# Patient Record
Sex: Male | Born: 1935 | Race: White | Hispanic: No | Marital: Married | Smoking: Never smoker
Health system: Southern US, Community
[De-identification: ages and names within clinical notes are randomized; demographics above are authoritative.]

---

## 2007-09-08 ENCOUNTER — Encounter: Payer: Self-pay | Admitting: Nurse Practitioner

## 2007-09-26 ENCOUNTER — Encounter: Payer: Self-pay | Admitting: Nurse Practitioner

## 2007-10-27 ENCOUNTER — Encounter: Payer: Self-pay | Admitting: Nurse Practitioner

## 2007-11-26 ENCOUNTER — Encounter: Payer: Self-pay | Admitting: Nurse Practitioner

## 2015-10-02 ENCOUNTER — Other Ambulatory Visit (HOSPITAL_COMMUNITY): Payer: Self-pay | Admitting: Cardiothoracic Surgery

## 2015-10-02 ENCOUNTER — Ambulatory Visit (HOSPITAL_COMMUNITY)
Admission: RE | Admit: 2015-10-02 | Discharge: 2015-10-02 | Disposition: A | Discharge status: Home / Self Care | Payer: Medicare HMO | Source: Ambulatory Visit | Attending: Cardiothoracic Surgery | Admitting: Cardiothoracic Surgery

## 2015-10-02 DIAGNOSIS — M79604 Pain in right leg: Secondary | ICD-10-CM

## 2015-10-02 DIAGNOSIS — M79605 Pain in left leg: Secondary | ICD-10-CM

## 2015-10-02 DIAGNOSIS — M7989 Other specified soft tissue disorders: Secondary | ICD-10-CM | POA: Diagnosis present

## 2016-03-03 ENCOUNTER — Emergency Department (HOSPITAL_COMMUNITY)
Admission: EM | Admit: 2016-03-03 | Discharge: 2016-03-03 | Disposition: A | Discharge status: Home / Self Care | Payer: Medicare HMO | Attending: Emergency Medicine | Admitting: Emergency Medicine

## 2016-03-03 ENCOUNTER — Emergency Department (HOSPITAL_COMMUNITY): Payer: Medicare HMO

## 2016-03-03 ENCOUNTER — Encounter (HOSPITAL_COMMUNITY): Payer: Self-pay | Admitting: *Deleted

## 2016-03-03 DIAGNOSIS — Y939 Activity, unspecified: Secondary | ICD-10-CM | POA: Diagnosis not present

## 2016-03-03 DIAGNOSIS — Z043 Encounter for examination and observation following other accident: Secondary | ICD-10-CM | POA: Insufficient documentation

## 2016-03-03 DIAGNOSIS — R2681 Unsteadiness on feet: Secondary | ICD-10-CM | POA: Insufficient documentation

## 2016-03-03 DIAGNOSIS — W010XXA Fall on same level from slipping, tripping and stumbling without subsequent striking against object, initial encounter: Secondary | ICD-10-CM | POA: Diagnosis not present

## 2016-03-03 DIAGNOSIS — Y92009 Unspecified place in unspecified non-institutional (private) residence as the place of occurrence of the external cause: Secondary | ICD-10-CM | POA: Insufficient documentation

## 2016-03-03 DIAGNOSIS — Z79899 Other long term (current) drug therapy: Secondary | ICD-10-CM | POA: Diagnosis not present

## 2016-03-03 DIAGNOSIS — S99911A Unspecified injury of right ankle, initial encounter: Secondary | ICD-10-CM | POA: Diagnosis present

## 2016-03-03 DIAGNOSIS — W19XXXA Unspecified fall, initial encounter: Secondary | ICD-10-CM

## 2016-03-03 DIAGNOSIS — Y999 Unspecified external cause status: Secondary | ICD-10-CM | POA: Diagnosis not present

## 2016-03-03 NOTE — ED Triage Notes (Signed)
Pt comes in from [facility] from home. Pt had an unwitnessed fall at home. He is having right ankle pain as well. Pt was on the floor for about an hour. Pt states he tripped and fell. Pt alert and oriented at triage. Pt told EMS he was having right groin pain.   CBG 285.

## 2016-03-03 NOTE — ED Provider Notes (Signed)
[facility]Y DEPT Provider Note   CSN: [id number] Arrival date & time: 03/03/16  [id number]   By signing my name below, I, Bobbie Stackhristopher Reid, attest that this documentation has been prepared under the direction and in the presence of Nira ConnPedro Eduardo Cardama, MD. Electronically Signed: Bobbie Stackhristopher Reid, Scribe. 03/03/16. 9:53 AM. History   Chief Complaint Chief Complaint  Patient presents with  . Fall    The history is provided by the patient and a relative. No language interpreter was used.    HPI Comments: Sean Montgomery is a 83 y.o. male brought in by ambulance, who presents to the Emergency Department complaining of groin pain s/p mechanical fall that occurred around 8:30 am today; which has since resolved en route. While en route to the ED patient was experiencing some associated ankle pain which has since resolved. He denies head injury; LOC, or amnesia to the event.  Currently denies any physical complaints.  Per family: Patient has been falling quite frequently recently due to shuffling gait. He has been having weakness in his legs for the past 6 months. He denies dizziness, headache, fever, congestion, and rhinorrhea. Patient did eat breakfast this morning.   History reviewed. No pertinent past medical history.  There are no active problems to display for this patient.   History reviewed. No pertinent surgical history.     Home Medications    Prior to Admission medications   Medication Sig Start Date End Date Taking? Authorizing Provider  doxazosin (CARDURA) 8 MG tablet Take 1 tablet by mouth daily. 12/30/15  Yes Historical Provider, MD  furosemide (LASIX) 20 MG tablet Take 1 tablet by mouth daily. 03/01/16  Yes Historical Provider, MD  lisinopril (PRINIVIL,ZESTRIL) 10 MG tablet Take 1 tablet by mouth daily. 03/01/16  Yes Historical Provider, MD  Vitamin D, Ergocalciferol, (DRISDOL) 50000 units CAPS capsule Take 50,000 Units by mouth every 7 (seven) days.   Yes Historical  Provider, MD    Family History No family history on file.  Social History Social History  Substance Use Topics  . Smoking status: Never Smoker  . Smokeless tobacco: Never Used  . Alcohol use No     Allergies   Patient has no known allergies.   Review of Systems Review of Systems A complete 10 system review of systems was obtained and all systems are negative except as noted in the HPI and PMH.    Physical Exam Updated Vital Signs BP 132/80 (BP Location: Left Arm)   Pulse 109   Temp 98.3 F (36.8 C) (Oral)   Resp 18   Ht 6' (1.829 m)   Wt 155 lb (70.3 kg)   SpO2 95%   BMI 21.02 kg/m   Physical Exam  Constitutional: He is oriented to person, place, and time. He appears well-developed and well-nourished. No distress.  HENT:  Head: Normocephalic.  Right Ear: External ear normal.  Left Ear: External ear normal.  Mouth/Throat: Oropharynx is clear and moist.  Eyes: Conjunctivae and EOM are normal. Pupils are equal, round, and reactive to light. Right eye exhibits no discharge. Left eye exhibits no discharge. No scleral icterus.  Neck: Normal range of motion. Neck supple.  Cardiovascular: Regular rhythm and normal heart sounds.  Exam reveals no gallop and no friction rub.   No murmur heard. Pulses:      Radial pulses are 2+ on the right side, and 2+ on the left side.       Dorsalis pedis pulses are 2+ on the right side, and  2+ on the left side.  Pulmonary/Chest: Effort normal and breath sounds normal. No stridor. No respiratory distress.  Abdominal: Soft. He exhibits no distension. There is no tenderness.  Musculoskeletal:       Right hip: He exhibits normal range of motion, normal strength, no tenderness and no bony tenderness.       Left hip: He exhibits normal range of motion, normal strength, no tenderness and no bony tenderness.       Right knee: No tenderness found.       Left knee: No tenderness found.       Right ankle: No tenderness.       Left ankle: No  tenderness.       Cervical back: He exhibits no bony tenderness.       Thoracic back: He exhibits no bony tenderness.       Lumbar back: He exhibits no bony tenderness.  Clavicle stable. Chest stable to AP/Lat compression. Pelvis stable to Lat compression. No obvious extremity deformity. No chest or abdominal wall contusion.  Neurological: He is alert and oriented to person, place, and time. GCS eye subscore is 4. GCS verbal subscore is 5. GCS motor subscore is 6.  Moving all extremities   Skin: Skin is warm. He is not diaphoretic.     ED Treatments / Results  DIAGNOSTIC STUDIES: Oxygen Saturation is 95% on RA, adequate by my interpretation.    COORDINATION OF CARE: 9:53 AM Discussed treatment plan with pt at bedside and pt agreed to plan.  Labs (all labs ordered are listed, but only abnormal results are displayed) Labs Reviewed - No data to display  EKG  EKG Interpretation None       Radiology Ct Head Wo Contrast  Result Date: 03/03/2016 CLINICAL DATA:  83 year old male with history of fall this morning. No injury to the head. EXAM: CT HEAD WITHOUT CONTRAST TECHNIQUE: Contiguous axial images were obtained from the base of the skull through the vertex without intravenous contrast. COMPARISON:  No priors. FINDINGS: Brain: Mild cerebral atrophy. Patchy and confluent areas of decreased attenuation are noted throughout the deep and periventricular white matter of the cerebral hemispheres bilaterally, compatible with chronic microvascular ischemic disease.No evidence of acute infarction, hemorrhage, hydrocephalus, extra-axial collection or mass lesion/mass effect. Vascular: No hyperdense vessel or unexpected calcification. Skull: Normal. Negative for fracture or focal lesion. Sinuses/Orbits: No acute finding. Other: None. IMPRESSION: 1. No acute intracranial abnormalities. 2. Mild cerebral atrophy with extensive chronic microvascular ischemic changes in cerebral white matter, as above.  Electronically Signed   By: Trudie Reed M.D.   On: 03/03/2016 10:49    Procedures Procedures (including critical care time)  Medications Ordered in ED Medications - No data to display   Initial Impression / Assessment and Plan / ED Course  I have reviewed the triage vital signs and the nursing notes.  Pertinent labs & imaging results that were available during my care of the patient were reviewed by me and considered in my medical decision making (see chart for details).  Clinical Course as of Mar 03 1108  Wynelle Link Mar 03, 2016  1045 No acute injuries noted on exam. Patient is oriented 4 and able to clearly described events surrounding the episode. CT head obtained to rule out possible slow subdural hemorrhage from previous falls.  [PC]  1106 Ct head negative  [PC]  1106 Able to ambulate at baseline.  [PC]  1106 The patient is safe for discharge with strict return precautions.   [  PC]    Clinical Course User Index [PC] Nira Conn, MD      Final Clinical Impressions(s) / ED Diagnoses   Final diagnoses:  Fall, initial encounter   Disposition: Discharge  Condition: Good  I have discussed the results, Dx and Tx plan with the patient who expressed understanding and agree(s) with the plan. Discharge instructions discussed at great length. The patient was given strict return precautions who verbalized understanding of the instructions. No further questions at time of discharge.    Current Discharge Medication List      Follow Up: Altamease Oiler, FNP [address] [place] [address] [phone]  Schedule an appointment as soon as possible for a visit  As needed   I personally performed the services described in this documentation, which was scribed in my presence. The recorded information has been reviewed and is accurate.        Nira Conn, MD 03/03/16 [phone]

## 2016-03-03 NOTE — ED Notes (Signed)
Pt ambulated in hall with assistance, Burley Saveredro viewed pt walking and approved pt to go home.

## 2016-03-05 ENCOUNTER — Emergency Department
Admission: EM | Admit: 2016-03-05 | Discharge: 2016-03-09 | Disposition: A | Discharge status: Home / Self Care | Payer: Medicare HMO | Attending: Emergency Medicine | Admitting: Emergency Medicine

## 2016-03-05 ENCOUNTER — Emergency Department: Payer: Medicare HMO

## 2016-03-05 ENCOUNTER — Encounter: Payer: Self-pay | Admitting: Emergency Medicine

## 2016-03-05 DIAGNOSIS — F0391 Unspecified dementia with behavioral disturbance: Secondary | ICD-10-CM | POA: Diagnosis not present

## 2016-03-05 DIAGNOSIS — Z79899 Other long term (current) drug therapy: Secondary | ICD-10-CM | POA: Insufficient documentation

## 2016-03-05 DIAGNOSIS — R410 Disorientation, unspecified: Secondary | ICD-10-CM | POA: Insufficient documentation

## 2016-03-05 DIAGNOSIS — F03918 Unspecified dementia, unspecified severity, with other behavioral disturbance: Secondary | ICD-10-CM

## 2016-03-05 DIAGNOSIS — R2681 Unsteadiness on feet: Secondary | ICD-10-CM | POA: Insufficient documentation

## 2016-03-05 DIAGNOSIS — A498 Other bacterial infections of unspecified site: Secondary | ICD-10-CM

## 2016-03-05 DIAGNOSIS — R4182 Altered mental status, unspecified: Secondary | ICD-10-CM | POA: Diagnosis present

## 2016-03-05 DIAGNOSIS — R531 Weakness: Secondary | ICD-10-CM

## 2016-03-05 DIAGNOSIS — F0390 Unspecified dementia without behavioral disturbance: Secondary | ICD-10-CM

## 2016-03-05 DIAGNOSIS — I10 Essential (primary) hypertension: Secondary | ICD-10-CM

## 2016-03-05 LAB — COMPREHENSIVE METABOLIC PANEL
ALK PHOS: 80 U/L (ref 38–126)
ALT: 30 U/L (ref 17–63)
ANION GAP: 10 (ref 5–15)
AST: 42 U/L — ABNORMAL HIGH (ref 15–41)
Albumin: 3.8 g/dL (ref 3.5–5.0)
BILIRUBIN TOTAL: 1.2 mg/dL (ref 0.3–1.2)
BUN: 24 mg/dL — ABNORMAL HIGH (ref 6–20)
CO2: 22 mmol/L (ref 22–32)
Calcium: 8.8 mg/dL — ABNORMAL LOW (ref 8.9–10.3)
Chloride: 105 mmol/L (ref 101–111)
Creatinine, Ser: 0.97 mg/dL (ref 0.61–1.24)
Glucose, Bld: 188 mg/dL — ABNORMAL HIGH (ref 65–99)
Potassium: 4 mmol/L (ref 3.5–5.1)
Sodium: 137 mmol/L (ref 135–145)
TOTAL PROTEIN: 6.6 g/dL (ref 6.5–8.1)

## 2016-03-05 LAB — CBC WITH DIFFERENTIAL/PLATELET
BASOS PCT: 0 %
Basophils Absolute: 0 10*3/uL (ref 0–0.1)
EOS ABS: 0.1 10*3/uL (ref 0–0.7)
Eosinophils Relative: 2 %
HCT: 43.6 % (ref 40.0–52.0)
HEMOGLOBIN: 14.7 g/dL (ref 13.0–18.0)
Lymphocytes Relative: 6 %
Lymphs Abs: 0.5 10*3/uL — ABNORMAL LOW (ref 1.0–3.6)
MCH: 32.3 pg (ref 26.0–34.0)
MCHC: 33.7 g/dL (ref 32.0–36.0)
MCV: 95.9 fL (ref 80.0–100.0)
MONOS PCT: 8 %
Monocytes Absolute: 0.6 10*3/uL (ref 0.2–1.0)
NEUTROS PCT: 84 %
Neutro Abs: 6.6 10*3/uL — ABNORMAL HIGH (ref 1.4–6.5)
Platelets: 256 10*3/uL (ref 150–440)
RBC: 4.54 MIL/uL (ref 4.40–5.90)
RDW: 14.1 % (ref 11.5–14.5)
WBC: 7.8 10*3/uL (ref 3.8–10.6)

## 2016-03-05 LAB — URINALYSIS, COMPLETE (UACMP) WITH MICROSCOPIC
BACTERIA UA: NONE SEEN
Bilirubin Urine: NEGATIVE
GLUCOSE, UA: NEGATIVE mg/dL
HGB URINE DIPSTICK: NEGATIVE
Ketones, ur: NEGATIVE mg/dL
LEUKOCYTES UA: NEGATIVE
NITRITE: NEGATIVE
Protein, ur: 30 mg/dL — AB
SPECIFIC GRAVITY, URINE: 1.024 (ref 1.005–1.030)
pH: 5 (ref 5.0–8.0)

## 2016-03-05 LAB — URINE DRUG SCREEN, QUALITATIVE (ARMC ONLY)
Amphetamines, Ur Screen: NOT DETECTED
BARBITURATES, UR SCREEN: NOT DETECTED
BENZODIAZEPINE, UR SCRN: NOT DETECTED
COCAINE METABOLITE, UR ~~LOC~~: NOT DETECTED
Cannabinoid 50 Ng, Ur ~~LOC~~: NOT DETECTED
MDMA (Ecstasy)Ur Screen: NOT DETECTED
METHADONE SCREEN, URINE: NOT DETECTED
Opiate, Ur Screen: NOT DETECTED
Phencyclidine (PCP) Ur S: NOT DETECTED
TRICYCLIC, UR SCREEN: NOT DETECTED

## 2016-03-05 LAB — ETHANOL

## 2016-03-05 NOTE — ED Triage Notes (Signed)
Pt ivc by daughter. Pt lives alone and has been falling recently. After last fall, about 3 day ago, pt has been increasingly confused, not knowing where he is or what is going on. Daughter states she can no longer take care of him

## 2016-03-05 NOTE — ED Provider Notes (Signed)
[facility] Emergency Department Provider Note  ____________________________________________  Time seen: Approximately 4:08 PM  I have reviewed the triage vital signs and the nursing notes.   HISTORY  Chief Complaint Altered Mental Status Level 5 caveat:  Portions of the history and physical were unable to be obtained due to the patient's altered mental status    HPI Sean NearingWilliam A Montgomery is a 83 y.o. male sent to the ED under involuntary commitment initiated by his daughter due to confusion. Daughter is concerned the patient cannot take care of himself and the daughter states she Is no longer able to take care of him either. Patient also has been having some falls recently due to his confusion.  No reported acute complaints. The patient denies any acute complaints and states that he feels fine.     History reviewed. No pertinent past medical history. Hypertension  There are no active problems to display for this patient.    History reviewed. No pertinent surgical history.   Prior to Admission medications   Medication Sig Start Date End Date Taking? Authorizing Provider  doxazosin (CARDURA) 8 MG tablet Take 1 tablet by mouth daily. 12/30/15   Historical Provider, MD  furosemide (LASIX) 20 MG tablet Take 1 tablet by mouth daily. 03/01/16   Historical Provider, MD  lisinopril (PRINIVIL,ZESTRIL) 10 MG tablet Take 1 tablet by mouth daily. 03/01/16   Historical Provider, MD  Vitamin D, Ergocalciferol, (DRISDOL) 50000 units CAPS capsule Take 50,000 Units by mouth every 7 (seven) days.    Historical Provider, MD     Allergies Patient has no known allergies.   History reviewed. No pertinent family history.  Social History Social History  Substance Use Topics  . Smoking status: Never Smoker  . Smokeless tobacco: Never Used  . Alcohol use No    Review of Systems  Constitutional:   No fever or chills.  ENT:   No sore throat. No  rhinorrhea. Cardiovascular:   No chest pain. Respiratory:   No dyspnea or cough. Gastrointestinal:   Negative for abdominal pain, vomiting and diarrhea.  Musculoskeletal:   Negative for focal pain or swelling Neurological:   Negative for headaches 10-point ROS otherwise negative.  ____________________________________________   PHYSICAL EXAM:  VITAL SIGNS: ED Triage Vitals [03/05/16 1312]  Enc Vitals Group     BP 139/71     Pulse Rate 96     Resp      Temp 98.3 F (36.8 C)     Temp Source Oral     SpO2 98 %     Weight      Height      Head Circumference      Peak Flow      Pain Score      Pain Loc      Pain Edu?      Excl. in GC?     Vital signs reviewed, nursing assessments reviewed.   Constitutional:   Alert and orientedTo self. Well appearing and in no distress. Eyes:   No scleral icterus. No conjunctival pallor. PERRL. EOMI.  No nystagmus. ENT   Head:   Normocephalic and atraumatic.   Nose:   No congestion/rhinnorhea. No septal hematoma   Mouth/Throat:   MMM, no pharyngeal erythema. No peritonsillar mass.    Neck:   No stridor. No SubQ emphysema. No meningismus. Hematological/Lymphatic/Immunilogical:   No cervical lymphadenopathy. Cardiovascular:   RRR. Symmetric bilateral radial and DP pulses.  No murmurs.  Respiratory:   Normal respiratory effort  without tachypnea nor retractions. Breath sounds are clear and equal bilaterally. No wheezes/rales/rhonchi. Gastrointestinal:   Soft and nontender. Non distended. There is no CVA tenderness.  No rebound, rigidity, or guarding. Genitourinary:   deferred Musculoskeletal:   Nontender with normal range of motion in all extremities. No joint effusions.  No lower extremity tenderness.  No edema. Neurologic:   Normal speech and language.  CN 2-10 normal. Motor grossly intact. No gross focal neurologic deficits are appreciated.  Skin:    Skin is warm, dry and intact. No rash noted.  No petechiae, purpura, or  bullae.  ____________________________________________    LABS (pertinent positives/negatives) (all labs ordered are listed, but only abnormal results are displayed) Labs Reviewed  COMPREHENSIVE METABOLIC PANEL - Abnormal; Notable for the following:       Result Value   Glucose, Bld 188 (*)    BUN 24 (*)    Calcium 8.8 (*)    AST 42 (*)    All other components within normal limits  CBC WITH DIFFERENTIAL/PLATELET - Abnormal; Notable for the following:    Neutro Abs 6.6 (*)    Lymphs Abs 0.5 (*)    All other components within normal limits  URINALYSIS, COMPLETE (UACMP) WITH MICROSCOPIC - Abnormal; Notable for the following:    Color, Urine YELLOW (*)    APPearance CLEAR (*)    Protein, ur 30 (*)    Squamous Epithelial / LPF 0-5 (*)    All other components within normal limits  ETHANOL  URINE DRUG SCREEN, QUALITATIVE ([facility] ONLY)   ____________________________________________   EKG    ____________________________________________    RADIOLOGY  CT head unremarkable  ____________________________________________   PROCEDURES Procedures  ____________________________________________   INITIAL IMPRESSION / ASSESSMENT AND PLAN / ED COURSE  Pertinent labs & imaging results that were available during my care of the patient were reviewed by me and considered in my medical decision making (see chart for details).  Patient well appearing no acute distress. Brought to the ED under involuntary commitment initiated by daughter due to confusion and need for assistance with ADLs. The patient is not overtly psychotic, but I'll obtain a psychiatry consult for further evaluation given the clinical uncertainty and his limited ability to provide history.  It's unclear psychiatric treatment would benefit the patient. He may simply require skilled nursing care in a dementia unit. I will consult social work for assistance with his anticipated disposition pending psychiatry  recommendations.     Clinical Course    ____________________________________________   FINAL CLINICAL IMPRESSION(S) / ED DIAGNOSES  Final diagnoses:  Confusion      New Prescriptions   No medications on file     Portions of this note were generated with dragon dictation software. Dictation errors may occur despite best attempts at proofreading.    Sharman Cheek, MD 03/05/16 [phone]

## 2016-03-05 NOTE — ED Notes (Signed)
BEHAVIORAL HEALTH ROUNDING Patient sleeping: No. Patient alert and oriented: oriented to self  Behavior appropriate: Yes.  ; If no, describe:  Nutrition and fluids offered: yes Toileting and hygiene offered: Yes  Sitter present: q15 minute observations and security  monitoring Law enforcement present: Yes  ODS  

## 2016-03-05 NOTE — ED Notes (Signed)
IVC/Consult to Soc.Work

## 2016-03-05 NOTE — ED Notes (Signed)
CSW received consult for facility placement. Pt has not been seen by PT. Pending PT recommendations, CSW will move forward in getting pt placed in the appropriate level of care.   Jonathon JordanLynn B Tinzley Dalia, MSW, Theresia MajorsLCSWA [phone]

## 2016-03-05 NOTE — ED Notes (Signed)
Spoke with his daughter  Sean RainwaterMarlene Montgomery  [phone]  [id number] [phone]  POA  [facility]

## 2016-03-05 NOTE — ED Notes (Signed)
Patient gone to ct

## 2016-03-05 NOTE — ED Notes (Signed)

## 2016-03-05 NOTE — ED Notes (Signed)
Patient back form ct

## 2016-03-05 NOTE — ED Notes (Signed)
BEHAVIORAL HEALTH ROUNDING Patient sleeping: No. Patient alert and oriented: yes Behavior appropriate: Yes.  ; If no, describe:  Nutrition and fluids offered: yes Toileting and hygiene offered: Yes  Sitter present: q15 minute observations and security  monitoring Law enforcement present: Yes  ODS  

## 2016-03-06 DIAGNOSIS — F0391 Unspecified dementia with behavioral disturbance: Secondary | ICD-10-CM | POA: Diagnosis not present

## 2016-03-06 DIAGNOSIS — I10 Essential (primary) hypertension: Secondary | ICD-10-CM

## 2016-03-06 DIAGNOSIS — F03918 Unspecified dementia, unspecified severity, with other behavioral disturbance: Secondary | ICD-10-CM

## 2016-03-06 LAB — C DIFFICILE QUICK SCREEN W PCR REFLEX
C DIFFICILE (CDIFF) TOXIN: NEGATIVE
C Diff antigen: POSITIVE — AB

## 2016-03-06 LAB — CLOSTRIDIUM DIFFICILE BY PCR: CDIFFPCR: POSITIVE — AB

## 2016-03-06 MED ORDER — RISPERIDONE 0.5 MG PO TBDP
0.2500 mg | ORAL_TABLET | Freq: Four times a day (QID) | ORAL | Status: DC | PRN
  Administered 2016-03-06 – 2016-03-08 (×4): 0.25 mg via ORAL
  Filled 2016-03-06 (×4): qty 0.5

## 2016-03-06 MED ORDER — DOXAZOSIN MESYLATE 4 MG PO TABS
8.0000 mg | ORAL_TABLET | Freq: Every day | ORAL | Status: DC
  Administered 2016-03-06 – 2016-03-08 (×3): 8 mg via ORAL
  Filled 2016-03-06 (×5): qty 2

## 2016-03-06 MED ORDER — LORAZEPAM 2 MG/ML IJ SOLN
INTRAMUSCULAR | Status: AC
Start: 2016-03-06 — End: 2016-03-06
  Administered 2016-03-06: 1 mg via INTRAMUSCULAR
  Filled 2016-03-06: qty 1

## 2016-03-06 MED ORDER — FUROSEMIDE 40 MG PO TABS
20.0000 mg | ORAL_TABLET | Freq: Every day | ORAL | Status: DC
  Administered 2016-03-06 – 2016-03-08 (×3): 20 mg via ORAL
  Filled 2016-03-06 (×3): qty 1

## 2016-03-06 MED ORDER — LORAZEPAM 1 MG PO TABS
1.0000 mg | ORAL_TABLET | Freq: Once | ORAL | Status: DC
  Filled 2016-03-06: qty 1

## 2016-03-06 MED ORDER — RISPERIDONE 0.5 MG PO TABS
0.2500 mg | ORAL_TABLET | Freq: Every day | ORAL | Status: DC
Start: 2016-03-06 — End: 2016-03-09
  Administered 2016-03-07 – 2016-03-08 (×2): 0.25 mg via ORAL
  Filled 2016-03-06 (×3): qty 1

## 2016-03-06 MED ORDER — LORAZEPAM 1 MG PO TABS
1.0000 mg | ORAL_TABLET | ORAL | Status: DC | PRN
  Administered 2016-03-06: 1 mg via ORAL
  Filled 2016-03-06: qty 1

## 2016-03-06 MED ORDER — METRONIDAZOLE 500 MG PO TABS
ORAL_TABLET | ORAL | Status: AC
  Filled 2016-03-06: qty 1

## 2016-03-06 MED ORDER — LORAZEPAM 2 MG/ML IJ SOLN
1.0000 mg | Freq: Once | INTRAMUSCULAR | Status: AC
  Administered 2016-03-06: 1 mg via INTRAMUSCULAR

## 2016-03-06 MED ORDER — METRONIDAZOLE 500 MG PO TABS
500.0000 mg | ORAL_TABLET | Freq: Three times a day (TID) | ORAL | Status: DC
  Administered 2016-03-06 – 2016-03-09 (×8): 500 mg via ORAL
  Filled 2016-03-06 (×9): qty 1

## 2016-03-06 MED ORDER — LISINOPRIL 10 MG PO TABS
10.0000 mg | ORAL_TABLET | Freq: Every day | ORAL | Status: DC
  Administered 2016-03-06 – 2016-03-08 (×3): 10 mg via ORAL
  Filled 2016-03-06 (×3): qty 1

## 2016-03-06 NOTE — ED Provider Notes (Addendum)
  Physical Exam  BP (!) 145/56 (BP Location: Right Arm)   Pulse 82   Temp 98.3 F (36.8 C) (Oral)   Resp 20   SpO2 99%   Physical Exam  ED Course  Procedures  MDM Patient came in for agitation. Social work saw patient, pending placement. Was agitated and given meds last night. Had diarrhea and C diff antigen positive but toxin neg, likely previous exposure. WBC nl, no fever. Will continue to monitor.   8:42 AM C diff PCR positive. Called hospitalist, Dr. Winona LegatoVaickute, who recommend flagyl 500 mg TID x 14 days. He will need to remain on contact isolation but can still be placed. If he has poor PO intake or can't tolerate PO flagyl, then consider repeat labs and PO vanc vs admission. Abdomen nontender currently, vitals stable, afebrile.    Charlynne Panderavid Hsienta Yao, MD 03/06/16 [phone]    Charlynne Panderavid Hsienta Yao, MD 03/06/16 [phone]

## 2016-03-06 NOTE — ED Notes (Signed)
Pt. Cleaned and changed by this RN and Juanetta, EDT. Pt. Continues to verbalize the need to get up. Pt. Redirected.

## 2016-03-06 NOTE — ED Notes (Signed)
Pt. Becoming increasingly agitated. becoming aggressive towards staff. See following orders.

## 2016-03-06 NOTE — Clinical Social Work Note (Signed)
Clinical Social Work Assessment  Patient Details  Name: Particia NearingWilliam A Ogawa MRN: [id number] Date of Birth: 01/18/1921  Date of referral:  03/06/16               Reason for consult:  Facility Placement                Permission sought to share information with:  Facility Medical sales representativeContact Representative, Family Supports Permission granted to share information::  Yes, Verbal Permission Granted  Name::     Tommy RainwaterMarlene [place] [phone] (daughter)  Agency::     Relationship::     Contact Information:     Housing/Transportation Living arrangements for the past 2 months:  Single Family Home Source of Information:  Adult Children Patient Interpreter Needed:  None Criminal Activity/Legal Involvement Pertinent to Current Situation/Hospitalization:  No - Comment as needed Significant Relationships:  Adult Children Lives with:  Self Do you feel safe going back to the place where you live?  No Need for family participation in patient care:  Yes (Comment)  Care giving concerns: Pt is currently unable to care for himself independently.    Social Worker assessment / plan:  CSW received consult for facility placement. Pt is a 83 yo white male with a diagnosis of dementia. Pt presents to the ED after multiple falls at home. Pt lives alone but has a daughter that lives nearby. His daughter states that she is no longer able to help care for the pt as she has a back injury and is limited with her mobility as well. CSW engaged with pt's daughter at pt's bedside. Pt was asleep during the time of the assessment. Pt's daughter would like for pt to be considered for placement at an ALF or SNF. CSW spoke with EDP who has put in a consult for PT. PTs recommendations will determine if pt will go to SNF or ALF level of care. PT has not yet seen the pt. However, CSW will begin the referral process for the pt and adjust referral as needed after PT sees pt.  Employment status:  Retired Product/process development scientistnsurance information:  Managed  Medicare PT Recommendations:  Not assessed at this time Information / Referral to community resources:  Skilled Holiday representativeursing Facility, Other (Comment Required) (ALF, memory care unit)  Patient/Family's Response to care: Pt will d/c to ALF or SNF.  Patient/Family's Understanding of and Emotional Response to Diagnosis, Current Treatment, and Prognosis: Pt's family are appreciative of the care provided by CSW at this time.  Emotional Assessment Appearance:  Appears stated age Attitude/Demeanor/Rapport:  Lethargic Affect (typically observed):  Unable to Assess Orientation:  Fluctuating Orientation (Suspected and/or reported Sundowners) (Dementia ) Alcohol / Substance use:  Not Applicable Psych involvement (Current and /or in the community):  No (Comment)  Discharge Needs  Concerns to be addressed:  Care Coordination, Discharge Planning Concerns Readmission within the last 30 days:  No Current discharge risk:  Cognitively Impaired, Dependent with Mobility Barriers to Discharge:  Continued Medical Work up   KeyCorpLynn B Charlene Detter, LCSWA 03/06/2016, 2:33 PM

## 2016-03-06 NOTE — ED Notes (Signed)
Enteric Contact precautions initiated.

## 2016-03-06 NOTE — ED Notes (Signed)
Pt. Experienced more than 3 watery stools since 1900 with this RN. MD made aware. This RN placed pt on enteric precautions and order for testing was placed per MD.
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
## 2016-03-06 NOTE — NC FL2 (Signed)
[place] MEDICAID FL2 LEVEL OF CARE SCREENING TOOL     IDENTIFICATION  Patient Name: Sean Montgomery Birthdate: [birth year] Sex: male Admission Date (Current Location): 03/05/2016  [facility] and [place] Number:  Chiropodist and Address:  [facility], [address] [place], [place] [address]      Provider Number: [id number]  Attending Physician Name and Address:  No att. providers found  Relative Name and Phone Number:       Current Level of Care: Hospital Recommended Level of Care: Assisted Living Facility, Memory Care Prior Approval Number:    Date Approved/Denied:   PASRR Number:    Discharge Plan: ALF memory care    Current Diagnoses: Patient Active Problem List   Diagnosis Date Noted  . Dementia with aggressive behavior 03/06/2016  . Hypertension 03/06/2016    Orientation RESPIRATION BLADDER Height & Weight     Self, Place  Normal Incontinent Weight:   Height:     BEHAVIORAL SYMPTOMS/MOOD NEUROLOGICAL BOWEL NUTRITION STATUS  Other (Comment) (Pt can exhibit verbal agression at times but is easily redirected with the proper support. Pt has no history of physical agression.)  (None) Continent Diet (Thin fluid consistency)  AMBULATORY STATUS COMMUNICATION OF NEEDS Skin   Extensive Assist (Requires at least a one person assist) Verbally Normal                       Personal Care Assistance Level of Assistance  Bathing, Feeding, Dressing Bathing Assistance: Limited assistance Feeding assistance: Independent Dressing Assistance: Limited assistance     Functional Limitations Info  Sight, Hearing, Speech Sight Info: Adequate Hearing Info: Impaired (Pt is hard of hearing ) Speech Info: Adequate    SPECIAL CARE FACTORS FREQUENCY                       Contractures Contractures Info: Not present    Additional Factors Info  Code Status, Allergies Code Status Info: Not on file  Allergies Info: No known allergies             Current Medications (03/06/2016):  This is the current hospital active medication list Current Facility-Administered Medications  Medication Dose Route Frequency Provider Last Rate Last Dose  . doxazosin (CARDURA) tablet 8 mg  8 mg Oral Daily Charlynne Pander, MD   8 mg at 03/06/16 1000  . furosemide (LASIX) tablet 20 mg  20 mg Oral Daily Charlynne Pander, MD   20 mg at 03/06/16 0906  . lisinopril (PRINIVIL,ZESTRIL) tablet 10 mg  10 mg Oral Daily Charlynne Pander, MD   10 mg at 03/06/16 0906  . metroNIDAZOLE (FLAGYL) tablet 500 mg  500 mg Oral Q8H Charlynne Pander, MD   500 mg at 03/06/16 0906  . risperiDONE (RISPERDAL M-TABS) disintegrating tablet 0.25 mg  0.25 mg Oral Q6H PRN Audery Amel, MD      . risperiDONE (RISPERDAL) tablet 0.25 mg  0.25 mg Oral QHS Audery Amel, MD       Current Outpatient Prescriptions  Medication Sig Dispense Refill  . doxazosin (CARDURA) 8 MG tablet Take 1 tablet by mouth daily.    . furosemide (LASIX) 20 MG tablet Take 1 tablet by mouth daily.    Marland Kitchen lisinopril (PRINIVIL,ZESTRIL) 10 MG tablet Take 1 tablet by mouth daily.    . Vitamin D, Ergocalciferol, (DRISDOL) 50000 units CAPS capsule Take 50,000 Units by mouth every 7 (seven) days.  Discharge Medications: Please see discharge summary for a list of discharge medications.  Relevant Imaging Results:  Relevant Lab Results:   Additional Information SSN: [id number]  Sean Montgomery, LCSWA

## 2016-03-06 NOTE — ED Notes (Signed)
Pt attempting to get out of bed and being uncooperative with sitter.

## 2016-03-06 NOTE — ED Notes (Signed)
Pt has a sitter at bedside for safety since he is occasionally trying to get out of bed. Pt is alert but confused secondary to his dementia diagnosis.

## 2016-03-06 NOTE — ED Notes (Signed)
IVC  PAPERS  RESCINDED  PER  DR  CLAPACS 

## 2016-03-06 NOTE — Evaluation (Signed)
Physical Therapy Evaluation Patient Details Name: Sean NearingWilliam A Mckiddy MRN: [id number] DOB: 09-Feb-1921 Today's Date: 03/06/2016   History of Present Illness  Pt currently in ED under observation due to falls at home and agitation. PT consulted to help determine discharge plans. Pt is AOx1 at time of evaluation and no family present to supplement history. Details obtained from medical record. History from pt felt to be unreliable  Clinical Impression  Pt admitted with above diagnosis. Pt currently with functional limitations due to the deficits listed below (see PT Problem List).  Pt history felt to be unreliable (AOx1) and no family present. History borrowed from medical record. Pt requires modA+1 for bed mobility and transfers. He is unable to ambulate at this time due to leaning posterior with inability to correct. Unable to attempt ambulation. Pt does follow approximately 90% of commands by therapist during evaluation. However he becomes increasingly agitated as evaluation progresses. He could be appropriate for SNF however if he demonstrates aggression toward staff he would likely return right back to the ED. He would be much better served in a locked dementia care ALF with [facility] facility.     Follow Up Recommendations SNF He could be appropriate for SNF however if he demonstrates aggression toward staff he would likely return right back to the ED. He would be much better served in a locked dementia care ALF [facility].      Equipment Recommendations  None recommended by PT;Other (comment) (TBD further at facility or with family discussion)    Recommendations for Other Services       Precautions / Restrictions Precautions Precautions: Fall Restrictions Weight Bearing Restrictions: No      Mobility  Bed Mobility Overal bed mobility: Needs Assistance Bed Mobility: Supine to Sit     Supine to sit: Mod assist     General bed mobility comments: Pt requires modA+1 due to  poor sequencing and confusion. LUE amputation so only able to utilize RUE to pull up. Requires LE assist to return to bed  Transfers Overall transfer level: Needs assistance Equipment used: 1 person hand held assist Transfers: Sit to/from Stand Sit to Stand: Mod assist         General transfer comment: Pt requires modA+1 for sit to stand transfers due to leaning posteriorly. Pt with poor standing balance requiring continual support. Attempted marching but pt falls back onto bed  Ambulation/Gait             General Gait Details: Unable to safely attempt at this time due to inability to balance in standing  Stairs            Wheelchair Mobility    Modified Rankin (Stroke Patients Only)       Balance Overall balance assessment: Needs assistance Sitting-balance support: No upper extremity supported Sitting balance-Leahy Scale: Fair     Standing balance support: Single extremity supported Standing balance-Leahy Scale: Poor                               Pertinent Vitals/Pain Pain Assessment: No/denies pain    Home Living Family/patient expects to be discharged to:: Unsure                      Prior Function           Comments: Medical record indicates pt lives at home alone. Pt states that he ambulates with  spc at home. Denies falls but medical record indicates multiple falls recently. Pt reports he cares for all ADLs/IADLs and drives     Hand Dominance   Dominant Hand: Right    Extremity/Trunk Assessment   Upper Extremity Assessment Upper Extremity Assessment: Overall WFL for tasks assessed;LUE deficits/detail LUE Deficits / Details: LUE above elbow amputation. L shoulder flexion and abduction to approximatley 100 degrees and able to take resistance provided by therapist            Communication   Communication: HOH  Cognition Arousal/Alertness: Awake/alert Behavior During Therapy: Agitated Overall Cognitive Status: No  family/caregiver present to determine baseline cognitive functioning                 General Comments: AOx1 at time of evaluation. Patient's history conflicts history in medical record provided by family. Pt becomes increasingly agitated as session progresses    General Comments      Exercises Other Exercises Other Exercises: Pt able to follow commands for hip flexion marches, abduction/adduction, and LAQ in sitting   Assessment/Plan    PT Assessment Patient needs continued PT services  PT Problem List Decreased strength;Decreased activity tolerance;Decreased balance;Decreased knowledge of use of DME;Decreased safety awareness;Decreased cognition          PT Treatment Interventions DME instruction;Gait training;Stair training;Therapeutic activities;Functional mobility training;Therapeutic exercise;Balance training;Neuromuscular re-education;Cognitive remediation;Patient/family education;Wheelchair mobility training    PT Goals (Current goals can be found in the Care Plan section)  Acute Rehab PT Goals PT Goal Formulation: Patient unable to participate in goal setting    Frequency Min 2X/week   Barriers to discharge Decreased caregiver support Pt lives alone    Co-evaluation               End of Session Equipment Utilized During Treatment: Gait belt Activity Tolerance: Treatment limited secondary to agitation Patient left: in bed;with call bell/phone within reach;with nursing/sitter in room      Functional Assessment Tool Used: clinical judgement Functional Limitation: Mobility: Walking and moving around Mobility: Walking and Moving Around Current Status (B1478): At least 80 percent but less than 100 percent impaired, limited or restricted Mobility: Walking and Moving Around Goal Status [phone]): At least 40 percent but less than 60 percent impaired, limited or restricted    Time: 1650-1705 PT Time Calculation (min) (ACUTE ONLY): 15 min   Charges:   PT  Evaluation $PT Eval Low Complexity: 1 Procedure     PT G Codes:   PT G-Codes **NOT FOR INPATIENT CLASS** Functional Assessment Tool Used: clinical judgement Functional Limitation: Mobility: Walking and moving around Mobility: Walking and Moving Around Current Status (Z3086): At least 80 percent but less than 100 percent impaired, limited or restricted Mobility: Walking and Moving Around Goal Status [phone]): At least 40 percent but less than 60 percent impaired, limited or restricted   Lynnea Maizes PT, DPT   Huprich,Jason 03/06/2016, 5:26 PM

## 2016-03-06 NOTE — ED Provider Notes (Signed)
-----------------------------------------   6:56 PM on 03/06/2016 -----------------------------------------  Patient was signed out to me as pending psychiatric disposition medically cleared. He does require Flagyl on discharge. He did fill out an NFL 2. Psychiatry feels she is safe to go to assisted living. Social work has found him a assisted living place to go to tomorrow, it is believed. We have rescinded his IVC after discussion with psychiatry.   Jeanmarie PlantJames A McShane, MD 03/06/16 [phone]

## 2016-03-06 NOTE — ED Notes (Signed)
MD consulted for medication for increased agitation. Awaiting new orders at this time.

## 2016-03-07 NOTE — NC FL2 (Signed)
[place] MEDICAID FL2 LEVEL OF CARE SCREENING TOOL     IDENTIFICATION  Patient Name: Sean Montgomery Birthdate: Mar 19, 1920 Sex: male Admission Date (Current Location): 03/05/2016  [facility] and [place] Number:  Chiropodist and Address:  [facility], [address] [place], [place] [address]      Provider Number: [id number]  Attending Physician Name and Address:  No att. providers found  Relative Name and Phone Number:       Current Level of Care: Hospital Recommended Level of Care: Skilled Nursing Facility Prior Approval Number:    Date Approved/Denied:  03/07/2016 PASRR Number:   [id number]   Discharge Plan: SNF    Current Diagnoses: Patient Active Problem List   Diagnosis Date Noted  . Dementia with aggressive behavior 03/06/2016  . Hypertension 03/06/2016    Orientation RESPIRATION BLADDER Height & Weight     Self, Place  Normal Incontinent Weight:   Height:     BEHAVIORAL SYMPTOMS/MOOD NEUROLOGICAL BOWEL NUTRITION STATUS  Other (Comment) (Pt exhibits some verbal aggression at times. However, pt is easily redirected with the proper support. Pt does not have a history of physical aggression. )  (None) Continent Diet (Thin fluid consistency )  AMBULATORY STATUS COMMUNICATION OF NEEDS Skin   Extensive Assist Verbally Normal                       Personal Care Assistance Level of Assistance  Bathing, Feeding, Dressing Bathing Assistance: Limited assistance Feeding assistance: Independent Dressing Assistance: Limited assistance     Functional Limitations Info  Sight, Hearing, Speech Sight Info: Adequate Hearing Info: Adequate (Pt is hard of hearing ) Speech Info: Adequate    SPECIAL CARE FACTORS FREQUENCY                       Contractures Contractures Info: Not present    Additional Factors Info  Code Status, Allergies Code Status Info: Not on file  Allergies Info: No known allergies             Current Medications (03/07/2016):  This is the current hospital active medication list Current Facility-Administered Medications  Medication Dose Route Frequency Provider Last Rate Last Dose  . doxazosin (CARDURA) tablet 8 mg  8 mg Oral Daily Charlynne Pander, MD   8 mg at 03/06/16 1000  . furosemide (LASIX) tablet 20 mg  20 mg Oral Daily Charlynne Pander, MD   20 mg at 03/06/16 0906  . lisinopril (PRINIVIL,ZESTRIL) tablet 10 mg  10 mg Oral Daily Charlynne Pander, MD   10 mg at 03/06/16 0906  . metroNIDAZOLE (FLAGYL) tablet 500 mg  500 mg Oral Q8H Charlynne Pander, MD   500 mg at 03/07/16 0538  . risperiDONE (RISPERDAL M-TABS) disintegrating tablet 0.25 mg  0.25 mg Oral Q6H PRN Audery Amel, MD   0.25 mg at 03/07/16 0824  . risperiDONE (RISPERDAL) tablet 0.25 mg  0.25 mg Oral QHS Audery Amel, MD       Current Outpatient Prescriptions  Medication Sig Dispense Refill  . doxazosin (CARDURA) 8 MG tablet Take 1 tablet by mouth daily.    . furosemide (LASIX) 20 MG tablet Take 1 tablet by mouth daily.    Marland Kitchen lisinopril (PRINIVIL,ZESTRIL) 10 MG tablet Take 1 tablet by mouth daily.    . Vitamin D, Ergocalciferol, (DRISDOL) 50000 units CAPS capsule Take 50,000 Units by mouth every 7 (seven) days.  Discharge Medications: Please see discharge summary for a list of discharge medications.  Relevant Imaging Results:  Relevant Lab Results:   Additional Information SSN: [id number]  Jonathon JordanLynn B Myriah Boggus, LCSWA

## 2016-03-07 NOTE — ED Provider Notes (Signed)
-----------------------------------------   7:35 AM on 03/07/2016 -----------------------------------------   Blood pressure (!) 190/85, pulse (!) 122, temperature 97.5 F (36.4 C), temperature source Oral, resp. rate 18, SpO2 96 %.  The patient had no acute events since last update.  Calm and cooperative at this time.  Disposition is pending Psychiatry/Behavioral Medicine team recommendations.     Jennye MoccasinBrian S Quigley, MD 03/07/16 [phone]

## 2016-03-07 NOTE — ED Notes (Signed)
Pt has a bed offer at [facility] (family's preference). However, the bed will not be available until tomorrow. CSW called pt's daughter Roddie McMarlene [phone] and informed her above. Pt's daughter would like to wait [facility] and have pt transported tomorrow. CSW also agreed to meet with pt and pt's daughter at pt's bedside today around 1:30pm today.  Per [facility] Citizenaroline at the [facility], the facility is currently working on [employer]aining insurance auth for the pt. CSW will continue to follow pt and assist as needed.  Jonathon JordanLynn B Cleburn Maiolo, MSW, Theresia MajorsLCSWA [phone]

## 2016-03-07 NOTE — ED Notes (Signed)
Patient visiting with family. Roddie McMarlene, patients daughter, requesting her number be left.  Home: [phone] Cell: [phone]

## 2016-03-07 NOTE — ED Notes (Signed)
Patient is resting comfortably at this time with no signs of distress present. Will continue to monitor.   

## 2016-03-07 NOTE — ED Notes (Signed)
VOL/Pending placement 

## 2016-03-07 NOTE — ED Notes (Signed)
CSW met with pt's daughter, Jamas Lav, yesterday and spoke about the d/c plan for the pt. Jamas Lav states that she would like for pt to go to [facility] to begin with and then possibly transition to an ALF memory care unit. PT has seen the pt and is recommending SNF. CSW completed a new FL2 for the pt and sent referrals to local SNFs. Pt's daughter has a preference of [facility] in [place]. CSW will monitor the hub and present bed offers to pt and pt's family as they become available.  Georga Kaufmann, MSW, [place]

## 2016-03-08 NOTE — ED Provider Notes (Signed)
-----------------------------------------   6:29 AM on 03/08/2016 -----------------------------------------   Blood pressure 140/81, pulse 100, temperature 97.7 F (36.5 C), temperature source Oral, resp. rate 18, SpO2 96 %.  The patient received PRN Respirdal this morning for agitation.  Threw his oral Flagyl at the nurse. Calm and cooperative at this time.  Nurse to try again later this morning to have patient take his antibiotic. Disposition is pending Psychiatry/Behavioral Medicine team recommendations.     Irean HongJade J Quinnton Bury, MD 03/08/16 0630

## 2016-03-08 NOTE — ED Notes (Signed)
Report to Bill, RN

## 2016-03-08 NOTE — Progress Notes (Signed)
LCSW received a call from [facility], [place] CitizenCaroline is working on [employer] and await further PT consult. LCSW uploaded it.  Delta Air LinesClaudine Ayman Brull LCSW [phone]

## 2016-03-08 NOTE — ED Notes (Signed)
Unable to obtain 0600 vitals due to patients aggressive behavior.

## 2016-03-08 NOTE — ED Notes (Signed)
Pt attempting to get up "to walk around his house". Unable to orient pt to his current location. Pt frustrated by attempts to orient him but has not been physically aggressive.

## 2016-03-08 NOTE — ED Notes (Signed)
Pt becoming increasingly agitated and insisting on getting out of bed. Rn Revonda Standard(Allison) notified

## 2016-03-08 NOTE — Progress Notes (Signed)
Physical Therapy Treatment Patient Details Name: Sean Montgomery MRN: [id number] DOB: December 06, 1920 Today's Date: 03/08/2016    History of Present Illness Pt currently in ED under observation due to falls at home and agitation. PT consulted to help determine discharge plans. Pt is AOx1 at time of evaluation and no family present to supplement history. Details obtained from medical record. History from pt felt to be unreliable    PT Comments    Pt is pleasantly confused during PT treatment today. He never becomes aggressive or agitated with therapist and is able to follow all simple, single-step commands. Pt is able to complete seated exercises at EOB and ambulate multiple laps in room. He requires modA+1 for bed mobility and minA+2 for transfers and ambulation due to instability and poor safety awareness. Pt is appropriate for SNF placement based on his behavior with therapist. Medical record indicates some agitation/aggression with other staff which might prove to be a barrier with SNF placement. Pt will benefit from skilled PT services to address deficits in strength, balance, and mobility in order to return to full function at home.   Follow Up Recommendations  SNF or ALF with HH PT in facility pending continued participation with therapy and staff.      Equipment Recommendations  None recommended by PT;Other (comment) (TBD further at facility or with family discussion)    Recommendations for Other Services       Precautions / Restrictions Precautions Precautions: Fall Restrictions Weight Bearing Restrictions: No    Mobility  Bed Mobility Overal bed mobility: Needs Assistance Bed Mobility: Supine to Sit     Supine to sit: Mod assist     General bed mobility comments: Pt requires modA+1 due to poor sequencing and confusion. LUE amputation so only able to utilize RUE to pull up. Requires bilateral LE assist of modA+1 to return to bed  Transfers Overall transfer level: Needs  assistance Equipment used: 2 person hand held assist Transfers: Sit to/from Stand Sit to Stand: Min assist;+2 safety/equipment         General transfer comment: Pt demonstrates improved anterior weight shifting today during transfers. Once upright able to stabilize and requires intermittent minA+2 for balance but mostly CGA with static balance  Ambulation/Gait Ambulation/Gait assistance: Min assist;+2 physical assistance Ambulation Distance (Feet): 60 Feet (30+30) Assistive device: 2 person hand held assist Gait Pattern/deviations: Decreased step length - right;Decreased step length - left;Shuffle Gait velocity: Decreased Gait velocity interpretation: <1.8 ft/sec, indicative of risk for recurrent falls General Gait Details: Pt takes short shuffling steps to ambulate multiple laps in room. He standing with crouched posture with flexed knees and hips as well as forward trunk lean. Unable to fully correct despite cues. Pt with intermittent stumbling requiring assist from therapist and CNA to prevent falls. Poor safety awareness and poor reaction time with stumbles. Denies DOE and no signs of respiratory distress with exertion   Stairs            Wheelchair Mobility    Modified Rankin (Stroke Patients Only)       Balance Overall balance assessment: Needs assistance Sitting-balance support: No upper extremity supported Sitting balance-Leahy Scale: Fair     Standing balance support: Bilateral upper extremity supported Standing balance-Leahy Scale: Poor Standing balance comment: CGA mostly in static stance but intermittent minA+2 to stabilize                    Cognition Arousal/Alertness: Awake/alert Behavior During Therapy: Restless Overall Cognitive Status: No  family/caregiver present to determine baseline cognitive functioning                 General Comments: Pt is pleasantly confused throughout session. Never becomes hostile or aggressive with  therapist.    Exercises General Exercises - Lower Extremity Long Arc Quad: Strengthening;Both;10 reps;Seated Heel Slides: Strengthening;Both;10 reps;Seated Hip ABduction/ADduction: Strengthening;Both;10 reps;Seated Hip Flexion/Marching: Strengthening;Both;10 reps;Seated Heel Raises: Strengthening;Both;10 reps;Seated    General Comments        Pertinent Vitals/Pain Pain Assessment: No/denies pain    Home Living                      Prior Function            PT Goals (current goals can now be found in the care plan section) Acute Rehab PT Goals PT Goal Formulation: Patient unable to participate in goal setting Progress towards PT goals: Progressing toward goals    Frequency    Min 2X/week      PT Plan Current plan remains appropriate    Co-evaluation             End of Session Equipment Utilized During Treatment: Gait belt Activity Tolerance: Patient tolerated treatment well Patient left: in bed;with call bell/phone within reach;with nursing/sitter in room     Time: 1031-1049 PT Time Calculation (min) (ACUTE ONLY): 18 min  Charges:  $Therapeutic Exercise: 8-22 mins                    G Codes:      Sharalyn Ink Sherae Santino PT, DPT   Malaquias Lenker 03/08/2016, 11:07 AM

## 2016-03-08 NOTE — ED Notes (Addendum)
BEHAVIORAL HEALTH ROUNDING Patient sleeping: No Patient alert and oriented: Yes Behavior appropriate: Yes Describe behavior: No inappropriate or unacceptable behaviors noted at this time.  Nutrition and fluids offered: Yes Toileting and hygiene offered: Yes Sitter present: 1-1 sitter at bedside  Law enforcement present: Yes Law enforcement agency: [employer] ([employer]) 

## 2016-03-08 NOTE — ED Notes (Signed)
DR CLAPACS RESCINDED IVC PAPERS/PT VOLUNTARY PENDING PLACEMENT.

## 2016-03-08 NOTE — ED Notes (Signed)
Pt. Refusing meds.

## 2016-03-09 MED ORDER — METRONIDAZOLE 500 MG PO TABS: 500.0000 mg | ORAL_TABLET | Freq: Three times a day (TID) | ORAL | 0 refills | Status: AC

## 2016-03-09 NOTE — Progress Notes (Signed)
Patients daughter has requested that her father NOT got to SNF and for him to return home. LCSW called ED secretary and advised them not to transport.  Patient is going to be picked up between 11-12pm by his daughter Maple MirzaMarlene POA [phone]  [facility] plan. LCSW to consult with EDP to complete DC instructions  Muzammil Bruins LCSW [phone]

## 2016-03-09 NOTE — ED Notes (Addendum)
BEHAVIORAL HEALTH ROUNDING Patient sleeping: No Patient alert and oriented: Yes Behavior appropriate: Yes Describe behavior: No inappropriate or unacceptable behaviors noted at this time.  Nutrition and fluids offered: Yes Toileting and hygiene offered: Yes Sitter present: 1-1 sitter at bedside  Law enforcement present: Yes Law enforcement agency: [employer] ([employer]) 

## 2016-03-09 NOTE — ED Notes (Addendum)
BEHAVIORAL HEALTH ROUNDING Patient sleeping: YES Patient alert and oriented: Sleeping Behavior appropriate: Sleeping Describe behavior: No inappropriate or unacceptable behaviors noted at this time.  Nutrition and fluids offered: Sleeping Toileting and hygiene offered: Sleeping Sitter present: 1-1 sitter at bedside  Law enforcement present: YES Law enforcement agency: [employer] ([employer]) 

## 2016-03-09 NOTE — ED Notes (Signed)
Daughter will be here to pick up patient. Belongings given back to pt, sitter helping pt dress.

## 2016-03-09 NOTE — Care Management Note (Signed)
Case Management Note  Patient Details  Name: Sean Montgomery MRN: [id number] Date of Birth: Feb 06, 1921  Subjective/Objective:   Home Health Physical Therapy and RN                 Action/Plan:Patient's daughter [name](POA) [phone] [facility]Fax [phone] She is aware they are not open on the weekends and agency will not contact her until Monday,verbalized understanding.    Expected Discharge Date:                  Expected Discharge Plan:     In-House Referral:     Discharge planning Services     Post Acute Care Choice:    Choice offered to:     DME Arranged:    DME Agency:     HH Arranged:  Yes HH Agency:   [facility]  Status of Service:   Physical Therapy and Registered nurse  If discussed at Long Length of Stay Meetings, dates discussed:    Additional Comments:Faxed to [phone]  Caren MacadamMichelle Zayli Villafuerte, RN 03/09/2016, 7:01 PM

## 2016-03-09 NOTE — ED Notes (Signed)
Pt eating breakfast 

## 2016-03-09 NOTE — ED Provider Notes (Addendum)
-----------------------------------------   7:38 AM on 03/09/2016 -----------------------------------------   Blood pressure (!) 143/66, pulse 96, temperature 97.7 F (36.5 C), temperature source Oral, resp. rate 18, SpO2 96 %.  The patient had no acute events since last update.  Calm and cooperative at this time.  Disposition is pending Psychiatry/Behavioral Medicine team recommendations.     Jeanmarie PlantJames A McShane, MD 03/09/16 [phone]   ----------------------------------------- 8:52 AM on 03/09/2016 -----------------------------------------  Patient no longer under IVC, and informed by the patient started that she has arranged private home health care and wants to take him home. We'll send him home with Flagyl.   Jeanmarie PlantJames A McShane, MD 03/09/16 [phone]

## 2016-03-12 NOTE — Care Management Note (Signed)
Case Management Note  Patient Details  Name: Particia NearingWilliam A Delsol MRN: [id number] Date of Birth: 15-May-1920  Subjective/Objective: [facility] to say they cannot take the referral for the patient because they are out of network. Called around and found [facility], who has services, and is in network for the [employer]. I have faxed the original packet, which includes the order on a hard copy script to [phone], to [facility].    I also called the POA Roddie McMarlene [phone], to let her know of the change . No answer on that line so I left a callback number.                Action/Plan:   Expected Discharge Date:                  Expected Discharge Plan:     In-House Referral:     Discharge planning Services     Post Acute Care Choice:    Choice offered to:     DME Arranged:    DME Agency:     HH Arranged:    HH Agency:     Status of Service:     If discussed at [employer] Length of Stay Meetings, dates discussed:    Additional Comments:  Berna BueCheryl Ryne Mctigue, RN 03/12/2016, 9:04 AM

## 2016-03-12 NOTE — Care Management Note (Signed)
Case Management Note  Patient Details  Name: Particia NearingWilliam A Robotham MRN: [id number] Date of Birth: 1920/07/27  Subjective/Objective:  Callback from pt. Daughter marlene. [facility] [phone].                  Action/Plan:   Expected Discharge Date:                  Expected Discharge Plan:     In-House Referral:     Discharge planning Services     Post Acute Care Choice:    Choice offered to:     DME Arranged:    DME Agency:     HH Arranged:    HH Agency:     Status of Service:     If discussed at [employer] Length of Stay Meetings, dates discussed:    Additional Comments:  Berna BueCheryl Kaimani Clayson, RN 03/12/2016, 1:14 PM

## 2017-01-23 IMAGING — US US EXTREM LOW VENOUS*R*
1 series · 13 of 24 positions shown · non-contrast
Comparison: None.

CLINICAL DATA: [AGE] with right leg edema or for 1 week.



[Series 1: us extrem low venous*right* · 0.05mm/px · 13 of 44 slices shown]
[im 1/44]
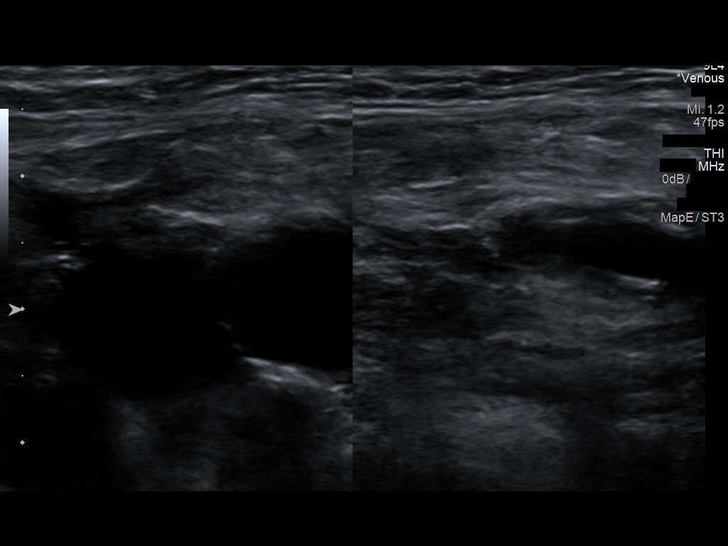
[im 4/44]
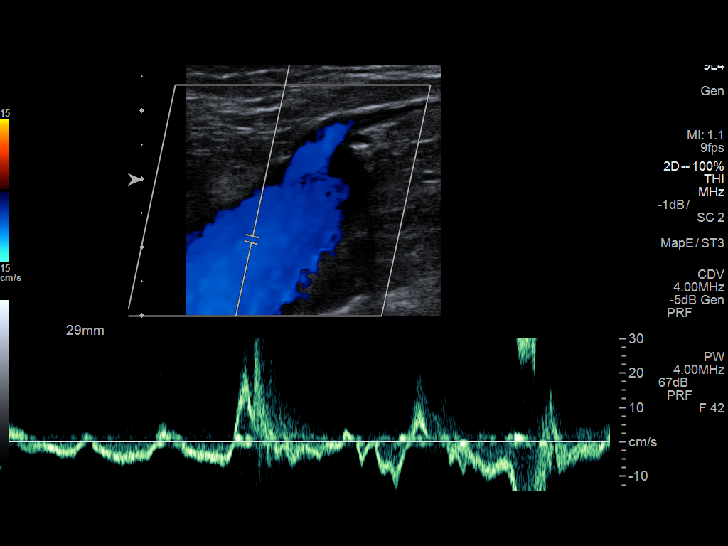
[im 8/44]
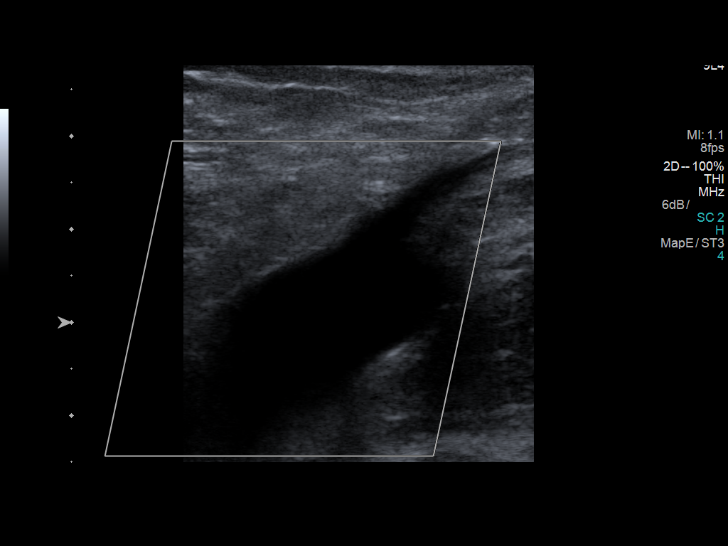
[im 12/44]
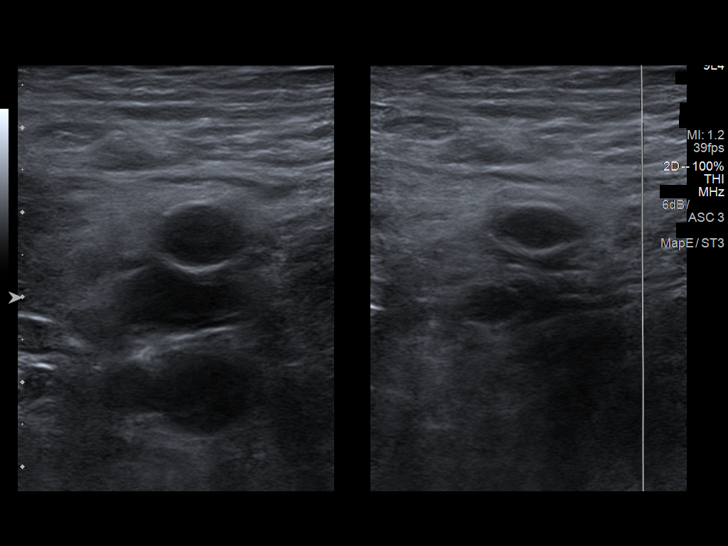
[im 15/44]
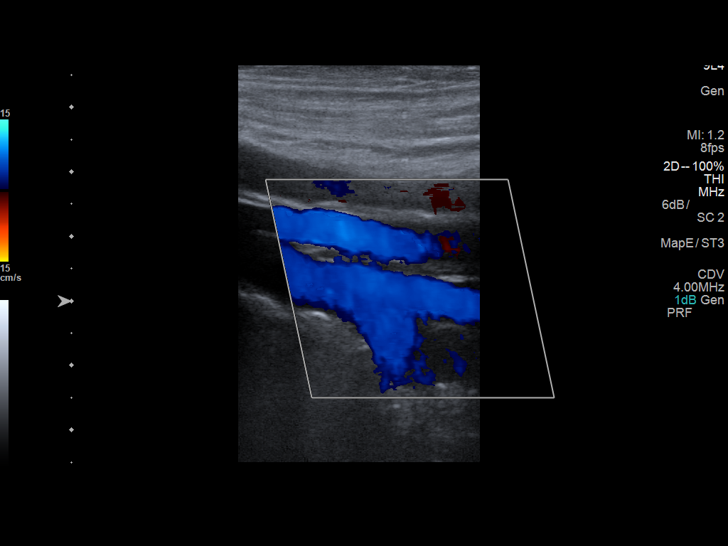
[im 19/44]
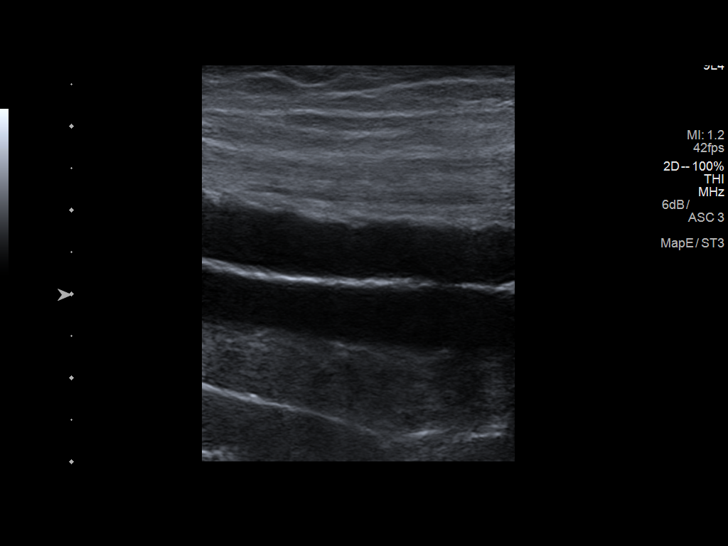
[im 23/44]
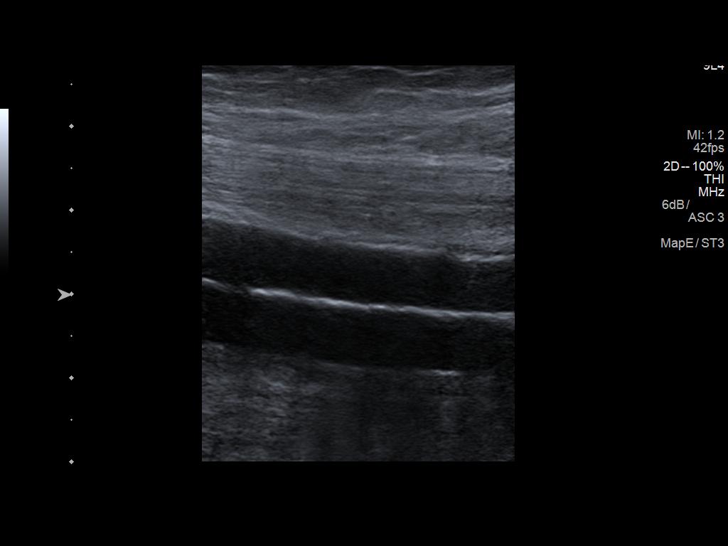
[im 25/44]
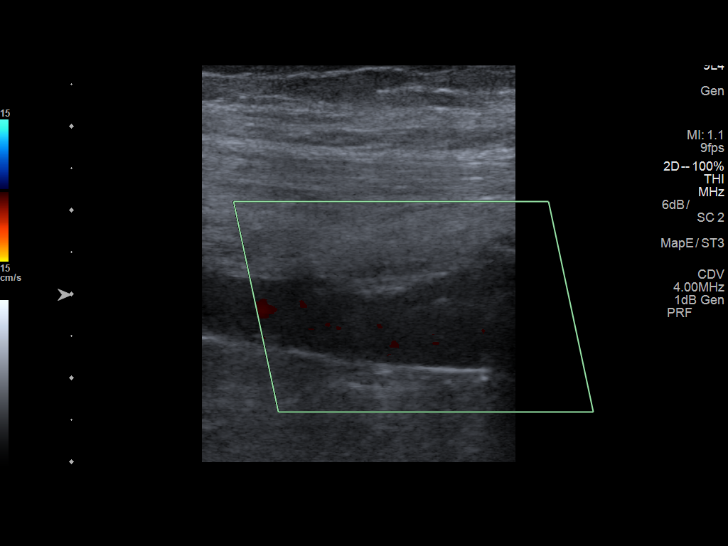
[im 29/44]
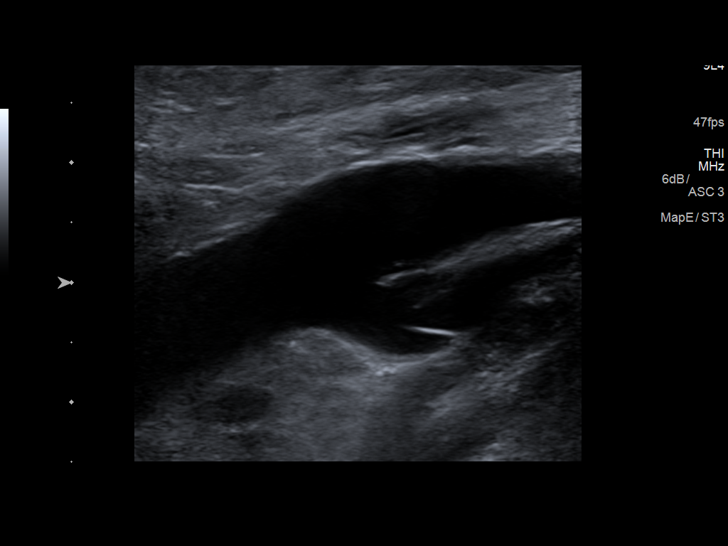
[im 32/44]
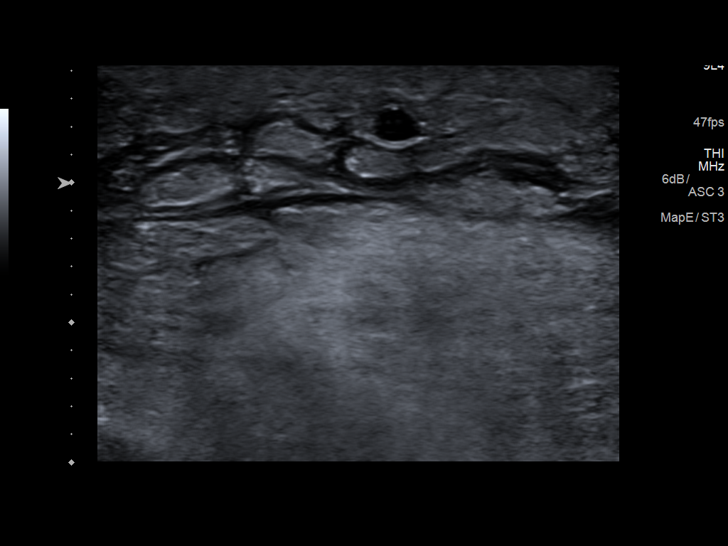
[im 36/44]
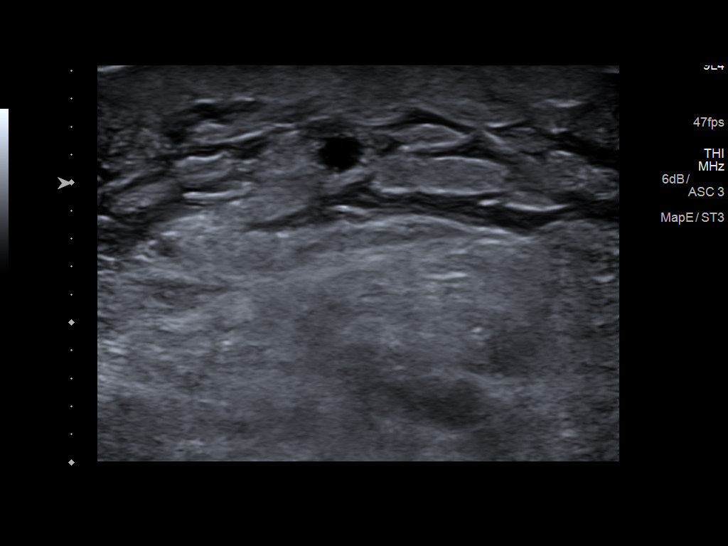
[im 40/44]
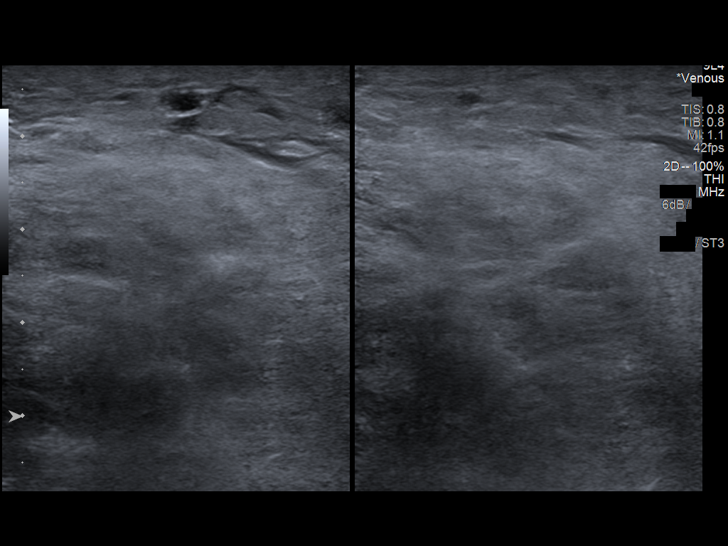
[im 44/44]
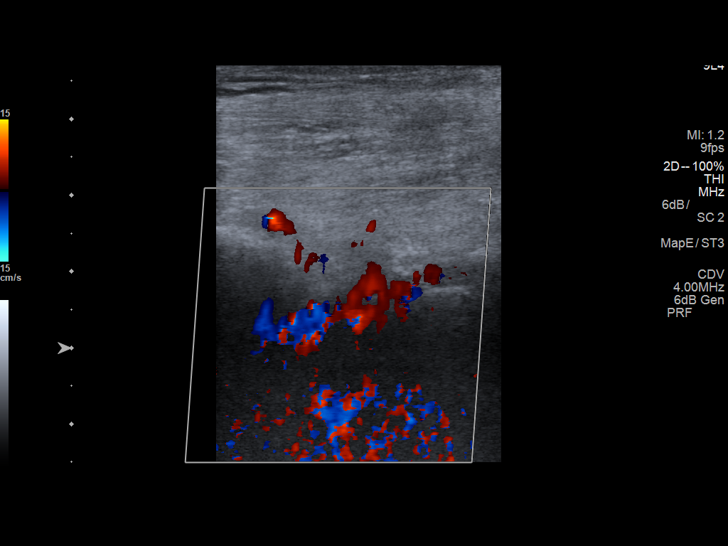

[13 of 24 positions shown; findings below may reference images not displayed]

FINDINGS: Contralateral Common Femoral Vein: Respiratory phasicity is normal
and symmetric with the symptomatic side. No evidence of thrombus.
Normal compressibility.

Common Femoral Vein: No evidence of thrombus. Normal
compressibility, respiratory phasicity and response to augmentation.

Saphenofemoral Junction: No evidence of thrombus. Normal
compressibility and flow on color Doppler imaging.

Profunda Femoral Vein: No evidence of thrombus. Normal
compressibility and flow on color Doppler imaging.

Femoral Vein: No evidence of thrombus. Normal compressibility,
respiratory phasicity and response to augmentation.

Popliteal Vein: No evidence of thrombus. Normal compressibility,
respiratory phasicity and response to augmentation.

Calf Veins: No evidence of thrombus. Normal compressibility and flow
on color Doppler imaging.

Superficial Great Saphenous Vein: No evidence of thrombus. Normal
compressibility and flow on color Doppler imaging.

Venous Reflux:  None.

Other Findings: Soft tissue edema noted in the subcutaneous tissues
of the right calf.
IMPRESSION: No evidence of right lower extremity deep venous thrombosis.

## 2017-01-25 DEATH — deceased

## 2018-07-25 IMAGING — CT CT HEAD W/O CM
4 series · 17 of 47 positions shown, 19 images · non-contrast
Comparison: No priors.

CLINICAL DATA: [AGE] male with history of fall this morning.
No injury to the head.

EXAM:
CT HEAD WITHOUT CONTRAST
TECHNIQUE: Contiguous axial images were obtained from the base of the skull
through the vertex without intravenous contrast.

[Series 2: head trauma wo · axial · 0.48mm/px · z∈[+265,+385]mm · 7 of 32 slices shown, 9 images]
[im 4/32  brain]
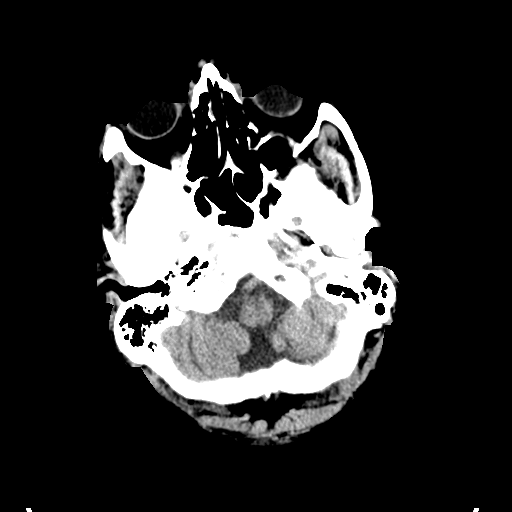
[im 4/32  bone]
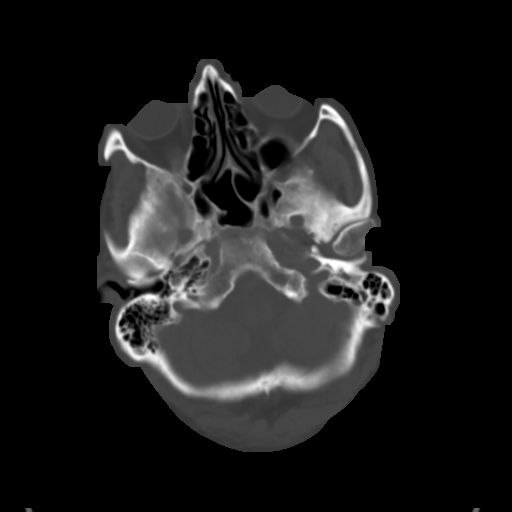
[im 8/32  brain]
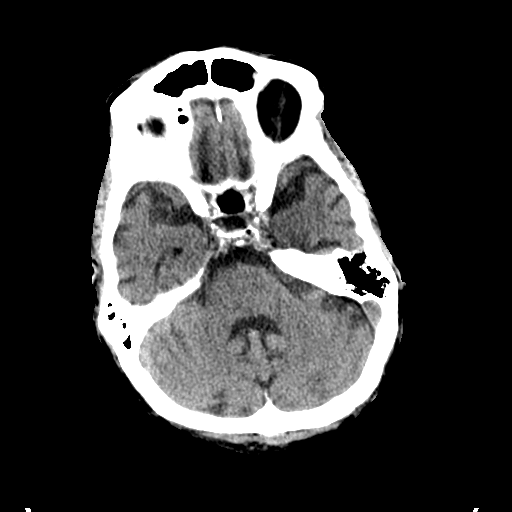
[im 12/32  brain]
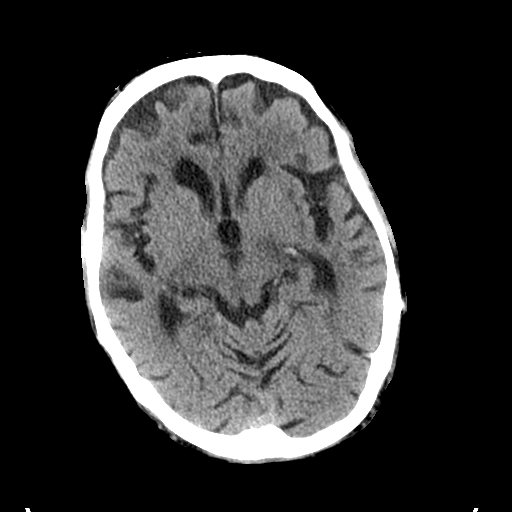
[im 16/32  brain]
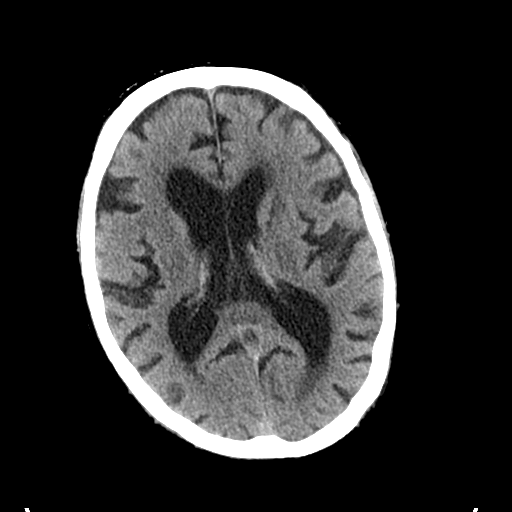
[im 20/32  brain]
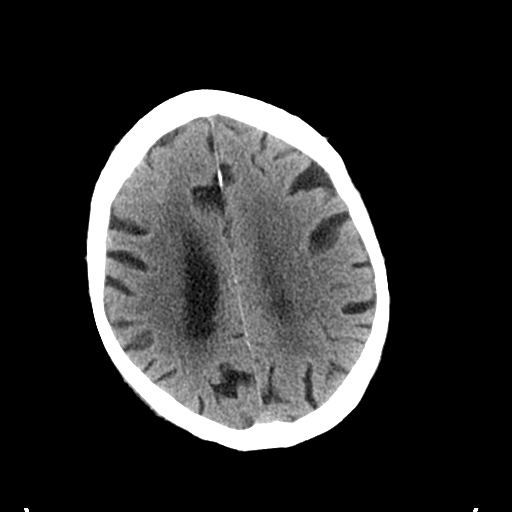
[im 20/32  bone]
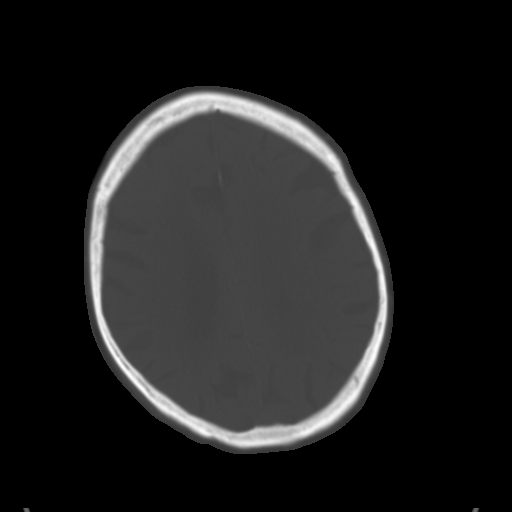
[im 24/32  brain]
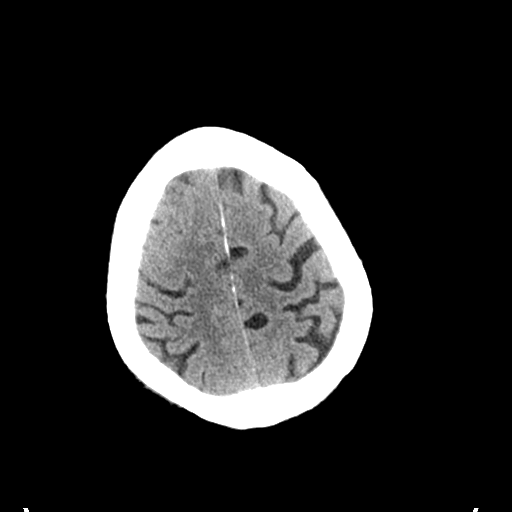
[im 28/32  brain]
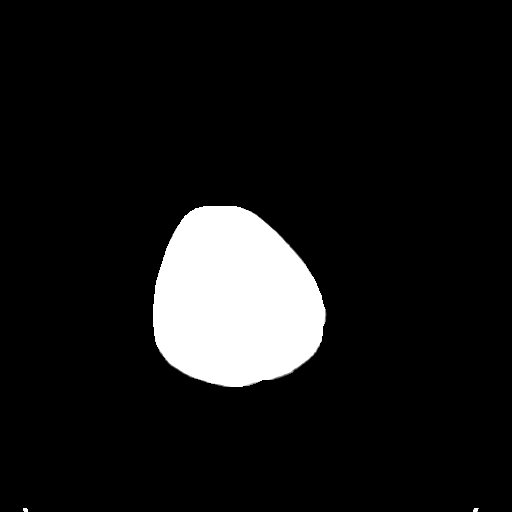

[Series 3: head bone · axial · 0.48mm/px · z∈[+264,+320]mm · 4 of 79 slices shown]
[im 8/79  bone]
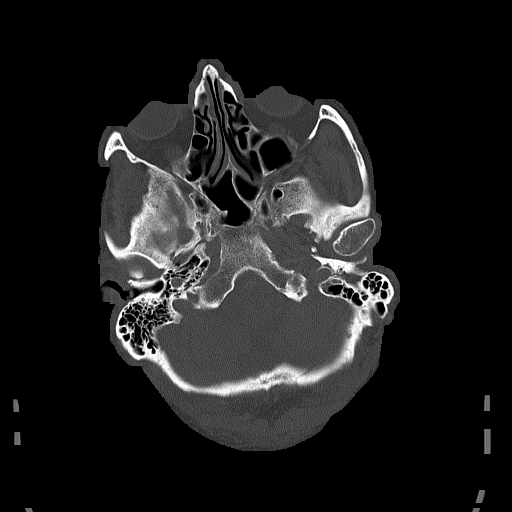
[im 16/79  bone]
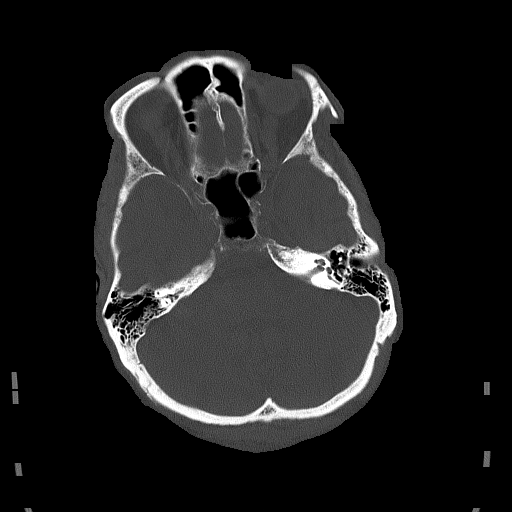
[im 24/79  bone]
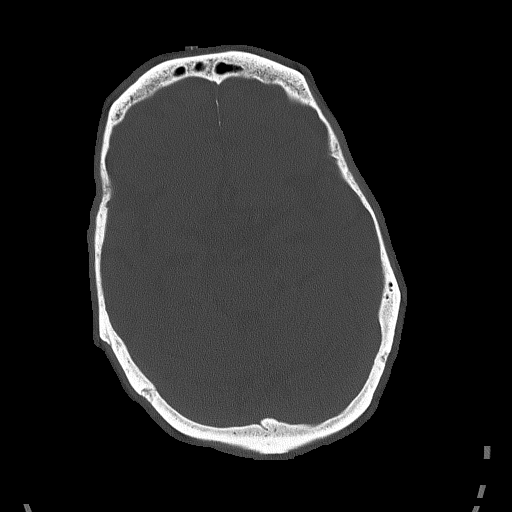
[im 36/79  bone]
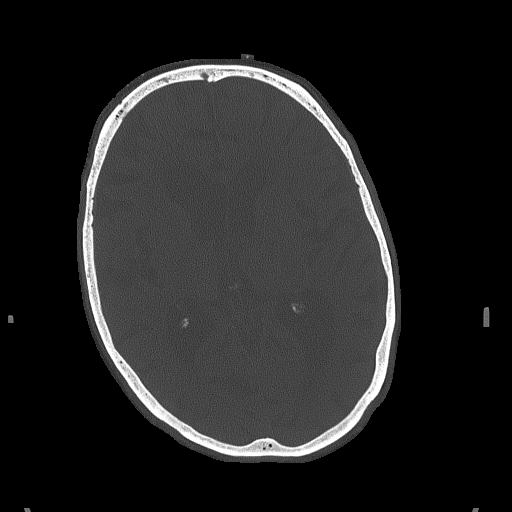

[Series 4: coronal soft tissue · coronal · 0.38mm/px · 3 of 80 slices shown]
[im 27/80  brain]
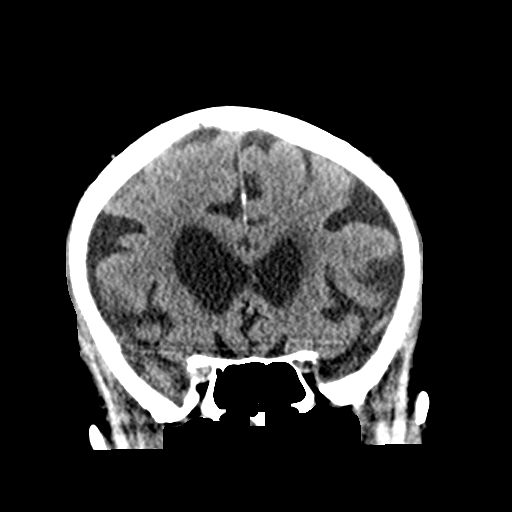
[im 36/80  brain]
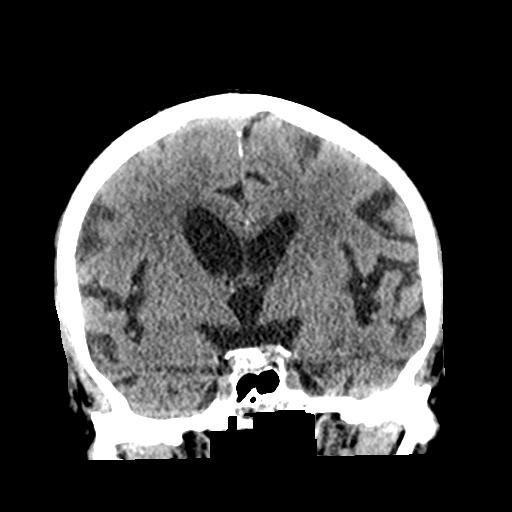
[im 44/80  brain]
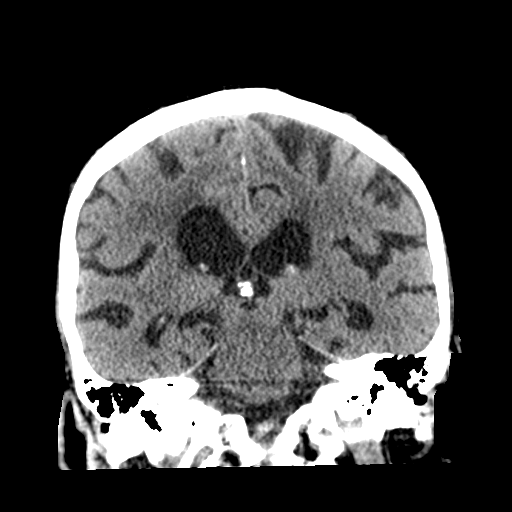

[Series 5: sagittal soft tissue · sagittal · 0.36mm/px · 3 of 65 slices shown]
[im 22/65  brain]
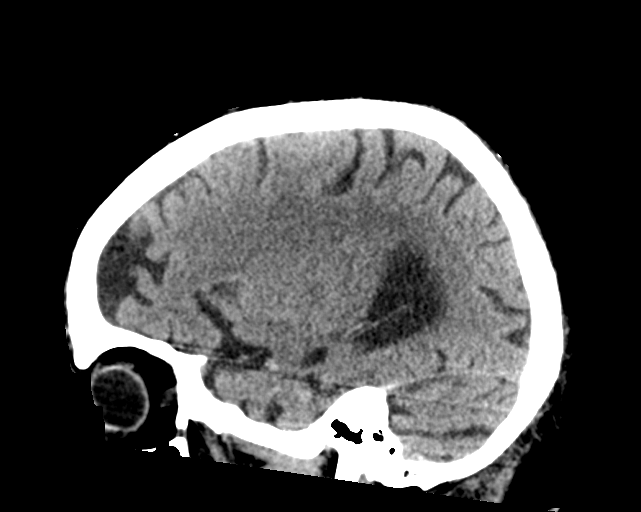
[im 33/65  brain]
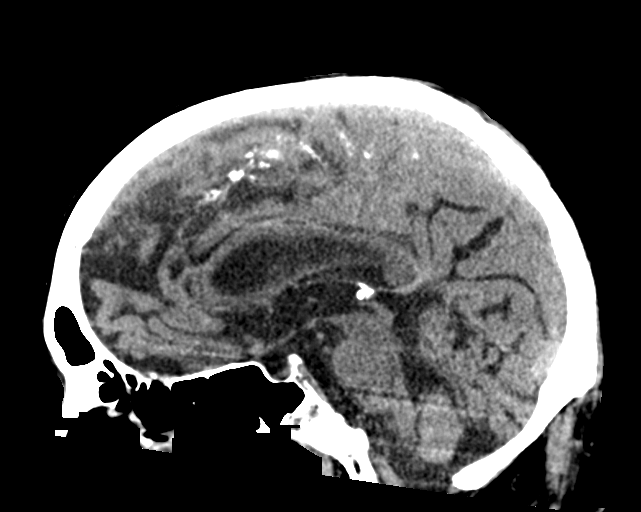
[im 43/65  brain]
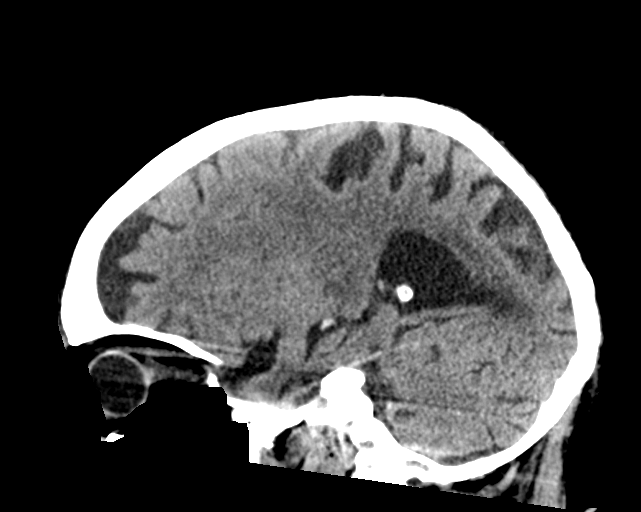

[17 of 47 positions shown; findings below may reference images not displayed]

FINDINGS: Brain: Mild cerebral atrophy. Patchy and confluent areas of
decreased attenuation are noted throughout the deep and
periventricular white matter of the cerebral hemispheres
bilaterally, compatible with chronic microvascular ischemic
disease.No evidence of acute infarction, hemorrhage, hydrocephalus,
extra-axial collection or mass lesion/mass effect.

Vascular: No hyperdense vessel or unexpected calcification.

Skull: Normal. Negative for fracture or focal lesion.

Sinuses/Orbits: No acute finding.

Other: None.
IMPRESSION: 1. No acute intracranial abnormalities.
2. Mild cerebral atrophy with extensive chronic microvascular
ischemic changes in cerebral white matter, as above.
